# Patient Record
Sex: Female | Born: 1935 | Race: White | Hispanic: No | Marital: Single | Smoking: Never smoker
Health system: Southern US, Community
[De-identification: ages and names within clinical notes are randomized; demographics above are authoritative.]

## PROBLEM LIST (undated history)

## (undated) DIAGNOSIS — E079 Disorder of thyroid, unspecified: Secondary | ICD-10-CM

## (undated) DIAGNOSIS — F329 Major depressive disorder, single episode, unspecified: Secondary | ICD-10-CM

## (undated) DIAGNOSIS — F32A Depression, unspecified: Secondary | ICD-10-CM

## (undated) DIAGNOSIS — I4891 Unspecified atrial fibrillation: Secondary | ICD-10-CM

## (undated) DIAGNOSIS — C069 Malignant neoplasm of mouth, unspecified: Secondary | ICD-10-CM

## (undated) DIAGNOSIS — I10 Essential (primary) hypertension: Secondary | ICD-10-CM

## (undated) HISTORY — PX: PARTIAL GLOSSECTOMY: SHX2173

## (undated) HISTORY — PX: BREAST LUMPECTOMY: SHX2

---

## 2011-08-05 ENCOUNTER — Emergency Department (INDEPENDENT_AMBULATORY_CARE_PROVIDER_SITE_OTHER): Payer: Medicare Other

## 2011-08-05 ENCOUNTER — Encounter: Payer: Self-pay | Admitting: *Deleted

## 2011-08-05 ENCOUNTER — Emergency Department (HOSPITAL_BASED_OUTPATIENT_CLINIC_OR_DEPARTMENT_OTHER)
Admission: EM | Admit: 2011-08-05 | Discharge: 2011-08-05 | Disposition: A | Discharge status: Other Acute Inpatient Hospital | Payer: Medicare Other | Attending: Emergency Medicine | Admitting: Emergency Medicine

## 2011-08-05 DIAGNOSIS — E079 Disorder of thyroid, unspecified: Secondary | ICD-10-CM | POA: Insufficient documentation

## 2011-08-05 DIAGNOSIS — R079 Chest pain, unspecified: Secondary | ICD-10-CM | POA: Insufficient documentation

## 2011-08-05 DIAGNOSIS — R000 Tachycardia, unspecified: Secondary | ICD-10-CM

## 2011-08-05 DIAGNOSIS — I10 Essential (primary) hypertension: Secondary | ICD-10-CM | POA: Insufficient documentation

## 2011-08-05 DIAGNOSIS — Z79899 Other long term (current) drug therapy: Secondary | ICD-10-CM | POA: Insufficient documentation

## 2011-08-05 DIAGNOSIS — F329 Major depressive disorder, single episode, unspecified: Secondary | ICD-10-CM | POA: Insufficient documentation

## 2011-08-05 DIAGNOSIS — R0602 Shortness of breath: Secondary | ICD-10-CM

## 2011-08-05 DIAGNOSIS — F3289 Other specified depressive episodes: Secondary | ICD-10-CM | POA: Insufficient documentation

## 2011-08-05 DIAGNOSIS — I4891 Unspecified atrial fibrillation: Secondary | ICD-10-CM

## 2011-08-05 DIAGNOSIS — R42 Dizziness and giddiness: Secondary | ICD-10-CM

## 2011-08-05 DIAGNOSIS — R5381 Other malaise: Secondary | ICD-10-CM

## 2011-08-05 HISTORY — DX: Essential (primary) hypertension: I10

## 2011-08-05 HISTORY — DX: Major depressive disorder, single episode, unspecified: F32.9

## 2011-08-05 HISTORY — DX: Depression, unspecified: F32.A

## 2011-08-05 HISTORY — DX: Disorder of thyroid, unspecified: E07.9

## 2011-08-05 LAB — APTT: aPTT: 28 seconds (ref 24–37)

## 2011-08-05 LAB — DIFFERENTIAL
Basophils Absolute: 0 10*3/uL (ref 0.0–0.1)
Eosinophils Absolute: 0.5 10*3/uL (ref 0.0–0.7)
Monocytes Absolute: 1.2 10*3/uL — ABNORMAL HIGH (ref 0.1–1.0)
Neutrophils Relative %: 75 % (ref 43–77)

## 2011-08-05 LAB — CBC
MCH: 29.2 pg (ref 26.0–34.0)
MCHC: 33.3 g/dL (ref 30.0–36.0)
Platelets: 452 10*3/uL — ABNORMAL HIGH (ref 150–400)

## 2011-08-05 LAB — BASIC METABOLIC PANEL
BUN: 27 mg/dL — ABNORMAL HIGH (ref 6–23)
GFR calc Af Amer: 81 mL/min — ABNORMAL LOW (ref >90)
GFR calc non Af Amer: 70 mL/min — ABNORMAL LOW (ref >90)
Potassium: 4.1 mEq/L (ref 3.5–5.1)
Sodium: 134 mEq/L — ABNORMAL LOW (ref 135–145)

## 2011-08-05 LAB — CARDIAC PANEL(CRET KIN+CKTOT+MB+TROPI)
CK, MB: 4.9 ng/mL — ABNORMAL HIGH (ref 0.3–4.0)
Troponin I: <0.3 ng/mL (ref <0.30)

## 2011-08-05 MED ORDER — DEXTROSE 5 % IV SOLN
5.0000 mg/h | INTRAVENOUS | Status: DC
  Administered 2011-08-05 (×2): 5 mg/h via INTRAVENOUS

## 2011-08-05 MED ORDER — DILTIAZEM HCL 25 MG/5ML IV SOLN
INTRAVENOUS | Status: AC
  Administered 2011-08-05: 10 mg
  Filled 2011-08-05: qty 5

## 2011-08-05 MED ORDER — DILTIAZEM HCL 100 MG IV SOLR
INTRAVENOUS | Status: AC
  Filled 2011-08-05: qty 100

## 2011-08-05 NOTE — ED Notes (Signed)
Report called toSabrina<RN at [facility] ER

## 2011-08-05 NOTE — ED Provider Notes (Signed)
History     CSN: [id number] Arrival date & time: 08/05/2011  2:34 PM   First MD Initiated Contact with Patient 08/05/11 1457      Chief Complaint  Patient presents with  . Chest Pain    (Consider location/radiation/quality/duration/timing/severity/associated sxs/prior treatment) HPI Comments: Patient states she did have palpitations yesterday while she was in the shower and felt weak and went and laid down and felt better. She started to feel weak again today around lunch and sit up and felt very dizzy like she may pass out. She denies any chest pain numbness or weakness on just one side. Family states she looked pale and light she might pass out however she never syncopized.  Patient is a 77 y.o. female presenting with chest pain. The history is provided by the patient and a relative.  Chest Pain The chest pain began 1 - 2 hours ago. Duration of episode(s) is 2 hours. Chest pain occurs constantly. The chest pain is unchanged. Associated with: Weakness and mild shortness of breath. At its most intense, the pain is at 0/10. The pain is currently at 0/10. Pain severity now: No pain. Chest pain is worsened by exertion (Worsened when she was trying to stand up and walk). Primary symptoms include shortness of breath. Pertinent negatives for primary symptoms include no fever, no syncope, no cough, no wheezing, no palpitations and no abdominal pain. She tried nothing for the symptoms.     Past Medical History  Diagnosis Date  . Thyroid disease   . Hypertension   . Depression     Past Surgical History  Procedure Date  . Breast lumpectomy   . Partial glossectomy     History reviewed. No pertinent family history.  History  Substance Use Topics  . Smoking status: Never Smoker   . Smokeless tobacco: Not on file  . Alcohol Use: No    OB History    Grav Para Term Preterm Abortions TAB SAB Ect Mult Living                  Review of Systems  Constitutional: Negative for fever.    HENT: Negative for neck pain.        Tongue pain due to recent removal of a portion of her tongue.  Respiratory: Positive for shortness of breath. Negative for cough and wheezing.   Cardiovascular: Positive for chest pain. Negative for palpitations and syncope.  Gastrointestinal: Negative for abdominal pain.  Genitourinary: Negative for dysuria.  All other systems reviewed and are negative.    Allergies  Lyrica; Penicillins; Pravachol; and Celebrex  Home Medications   Current Outpatient Rx  Name Route Sig Dispense Refill  . ASPIRIN EC 81 MG PO TBEC Oral Take 81 mg by mouth daily.      Marland Kitchen BACITRACIN 500 UNIT/GM EX OINT Topical Apply 1 application topically 2 (two) times daily.      Marland Kitchen CALCIUM CARBONATE-VITAMIN D 600-400 MG-UNIT PO TABS Oral Take 1 tablet by mouth daily.      Marland Kitchen DIPHENHYDRAMINE HCL 25 MG PO CAPS Oral Take 25 mg by mouth at bedtime.      Marland Kitchen HYDROCODONE-ACETAMINOPHEN 7.5-500 MG PO TABS Oral Take 1 tablet by mouth every 4 (four) hours as needed.     Marland Kitchen HYDROCODONE-ACETAMINOPHEN 7.5-500 MG/15ML PO SOLN Oral Take 15 mLs by mouth every 4 (four) hours as needed. For pain     . LEVOTHYROXINE SODIUM 88 MCG PO TABS Oral Take 88 mcg by mouth daily.      Marland Kitchen  LISINOPRIL 20 MG PO TABS Oral Take 20 mg by mouth daily.      Marland Kitchen MELATONIN 3 MG PO TABS Oral Take 1 tablet by mouth at bedtime.      Marland Kitchen ONE-DAILY MULTI VITAMINS PO TABS Oral Take 1 tablet by mouth daily.      Marland Kitchen PAROXETINE HCL 10 MG PO TABS Oral Take 10 mg by mouth at bedtime.       BP 129/71  Pulse 81  Temp(Src) 98.1 F (36.7 C) (Oral)  Resp 16  Ht 5\' 3"  (1.6 m)  Wt 150 lb (68.04 kg)  BMI 26.57 kg/m2  SpO2 98%  Physical Exam  Nursing note and vitals reviewed. Constitutional: She is oriented to person, place, and time. She appears well-developed and well-nourished.       Pale and diaphoretic  HENT:  Head: Normocephalic and atraumatic.       Pain over time with a portion removed sutures in place  Eyes: EOM are normal.  Pupils are equal, round, and reactive to light.  Cardiovascular: Normal heart sounds and intact distal pulses.  An irregularly irregular rhythm present. Tachycardia present.  Exam reveals no friction rub.   No murmur heard. Pulmonary/Chest: Effort normal and breath sounds normal. She has no wheezes. She has no rales.  Abdominal: Soft. Bowel sounds are normal. She exhibits no distension. There is no tenderness. There is no rebound and no guarding.  Musculoskeletal: Normal range of motion. She exhibits no tenderness.       No edema  Neurological: She is alert and oriented to person, place, and time. No cranial nerve deficit.  Skin: Skin is warm. No rash noted. There is pallor.  Psychiatric: She has a normal mood and affect. Her behavior is normal.    ED Course  Procedures (including critical care time)  Results for orders placed during the hospital encounter of 08/05/11  CBC      Component Value Range   WBC 17.0 (*) 4.0 - 10.5 (K/uL)   RBC 5.06  3.87 - 5.11 (MIL/uL)   Hemoglobin 14.8  12.0 - 15.0 (g/dL)   HCT 16.1  09.6 - 04.5 (%)   MCV 87.9  78.0 - 100.0 (fL)   MCH 29.2  26.0 - 34.0 (pg)   MCHC 33.3  30.0 - 36.0 (g/dL)   RDW 40.9  81.1 - 91.4 (%)   Platelets 452 (*) 150 - 400 (K/uL)  DIFFERENTIAL      Component Value Range   Neutrophils Relative 75  43 - 77 (%)   Lymphocytes Relative 15  12 - 46 (%)   Monocytes Relative 7  3 - 12 (%)   Eosinophils Relative 3  0 - 5 (%)   Basophils Relative 0  0 - 1 (%)   Neutro Abs 12.7 (*) 1.7 - 7.7 (K/uL)   Lymphs Abs 2.6  0.7 - 4.0 (K/uL)   Monocytes Absolute 1.2 (*) 0.1 - 1.0 (K/uL)   Eosinophils Absolute 0.5  0.0 - 0.7 (K/uL)   Basophils Absolute 0.0  0.0 - 0.1 (K/uL)  BASIC METABOLIC PANEL      Component Value Range   Sodium 134 (*) 135 - 145 (mEq/L)   Potassium 4.1  3.5 - 5.1 (mEq/L)   Chloride 95 (*) 96 - 112 (mEq/L)   CO2 29  19 - 32 (mEq/L)   Glucose, Bld 155 (*) 70 - 99 (mg/dL)   BUN 27 (*) 6 - 23 (mg/dL)   Creatinine, Ser  7.82  0.50 -  1.10 (mg/dL)   Calcium 60.4  8.4 - 10.5 (mg/dL)   GFR calc non Af Amer 70 (*) >90 (mL/min)   GFR calc Af Amer 81 (*) >90 (mL/min)  CARDIAC PANEL(CRET KIN+CKTOT+MB+TROPI)      Component Value Range   Total CK 50  7 - 177 (U/L)   CK, MB 4.9 (*) 0.3 - 4.0 (ng/mL)   Troponin I <0.30  <0.30 (ng/mL)   Relative Index RELATIVE INDEX IS INVALID  0.0 - 2.5   PROTIME-INR      Component Value Range   Prothrombin Time 12.5  11.6 - 15.2 (seconds)   INR 0.91  0.00 - 1.49   APTT      Component Value Range   aPTT 28  24 - 37 (seconds)   Dg Chest 2 View  08/05/2011  *RADIOLOGY REPORT*  Clinical Data: Rapid heart rate, dizziness, weakness, shortness of breath  CHEST - 2 VIEW  Comparison: None.  Findings: The cardiac silhouette, mediastinum, pulmonary vasculature are within normal limits.  Both lungs are clear. There is no acute bony abnormality.  IMPRESSION: There is no evidence of acute cardiac or pulmonary process.  Original Report Authenticated By: Brandon Melnick, M.D.     Date: 08/05/2011  Rate: 160  Rhythm: atrial fibrillation with RVR  QRS Axis: normal  Intervals: normal  ST/T Wave abnormalities: normal  Conduction Disutrbances:none  Narrative Interpretation:   Old EKG Reviewed: none available   Date: 08/05/2011  Rate: 103  Rhythm: normal sinus rhythm  QRS Axis: normal  Intervals: normal  ST/T Wave abnormalities: normal  Conduction Disutrbances:second-degree A-V block, ( Mobitz I )  Narrative Interpretation:   Old EKG Reviewed: changes noted   Date: 08/05/2011  Rate: 83  Rhythm: normal sinus rhythm  QRS Axis: normal  Intervals: normal  ST/T Wave abnormalities: normal  Conduction Disutrbances:none  Narrative Interpretation:   Old EKG Reviewed: changes noted   CRITICAL CARE Performed by: Gwyneth Sprout   Total critical care time: 45  Critical care time was exclusive of separately billable procedures and treating other patients.  Critical care was  necessary to treat or prevent imminent or life-threatening deterioration.  Critical care was time spent personally by me on the following activities: development of treatment plan with patient and/or surrogate as well as nursing, discussions with consultants, evaluation of patient's response to treatment, examination of patient, obtaining history from patient or surrogate, ordering and performing treatments and interventions, ordering and review of laboratory studies, ordering and review of radiographic studies, pulse oximetry and re-evaluation of patient's condition.   No diagnosis found.    MDM   Patient presented with weakness and was found to be in new onset A. fib with RVR. Patient has never been in this rhythm before. However over the last few weeks she is undergone surgery E. 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 Daughter says she has just been in pain at home but denies any fever shortness of breath coughing. Initially after a bolus of 10 mg of diltiazem and patient went from a heart rate of 160s to low 100s where she then developed a sinus rhythm with a Mobitz type I defect. And then went into a normal sinus rhythm with a rate of 80. However after one hour of observation patient went back into the irregular rhythm with a Mobitz type I and then developed A. fib once again. Patient was placed on a Cardizem drip at 5. Labs showed a leukocytosis of 1700 which is  less likely from the most recent surgery due to no other infectious symptoms doubt new infection. The labs were unremarkable. And will admit for further care.         Gwyneth Sprout, MD 08/05/11 [phone]

## 2011-08-05 NOTE — ED Notes (Signed)
Back into sinus ryth b/p 138/77  p78    Family remains at bedside

## 2011-08-05 NOTE — ED Notes (Signed)
bp 148/77  Hr 78   cardizem drip started

## 2011-08-05 NOTE — ED Notes (Signed)
Pt brought directly to room 6 in w/c by triage nurse, stating that manual pulse is weak, irregular and thready. Pt is pale, states that she feels weak, dizzy and sob at times. Pt had oral surgery last week for tongue cancer, and started feeling weak and dizzy today. Pt placed on monitor, hr in 160's irregular, dr. Anitra Lauth alerted to bedside, ekg in progress, iv access x 2 obtained while pt being triaged.

## 2013-07-23 IMAGING — CR DG CHEST 2V
2 series · 2 of 2 positions shown · non-contrast
Comparison: None.

CLINICAL DATA: Rapid heart rate, dizziness, weakness, shortness of
breath

CHEST - 2 VIEW

[w chest pa]
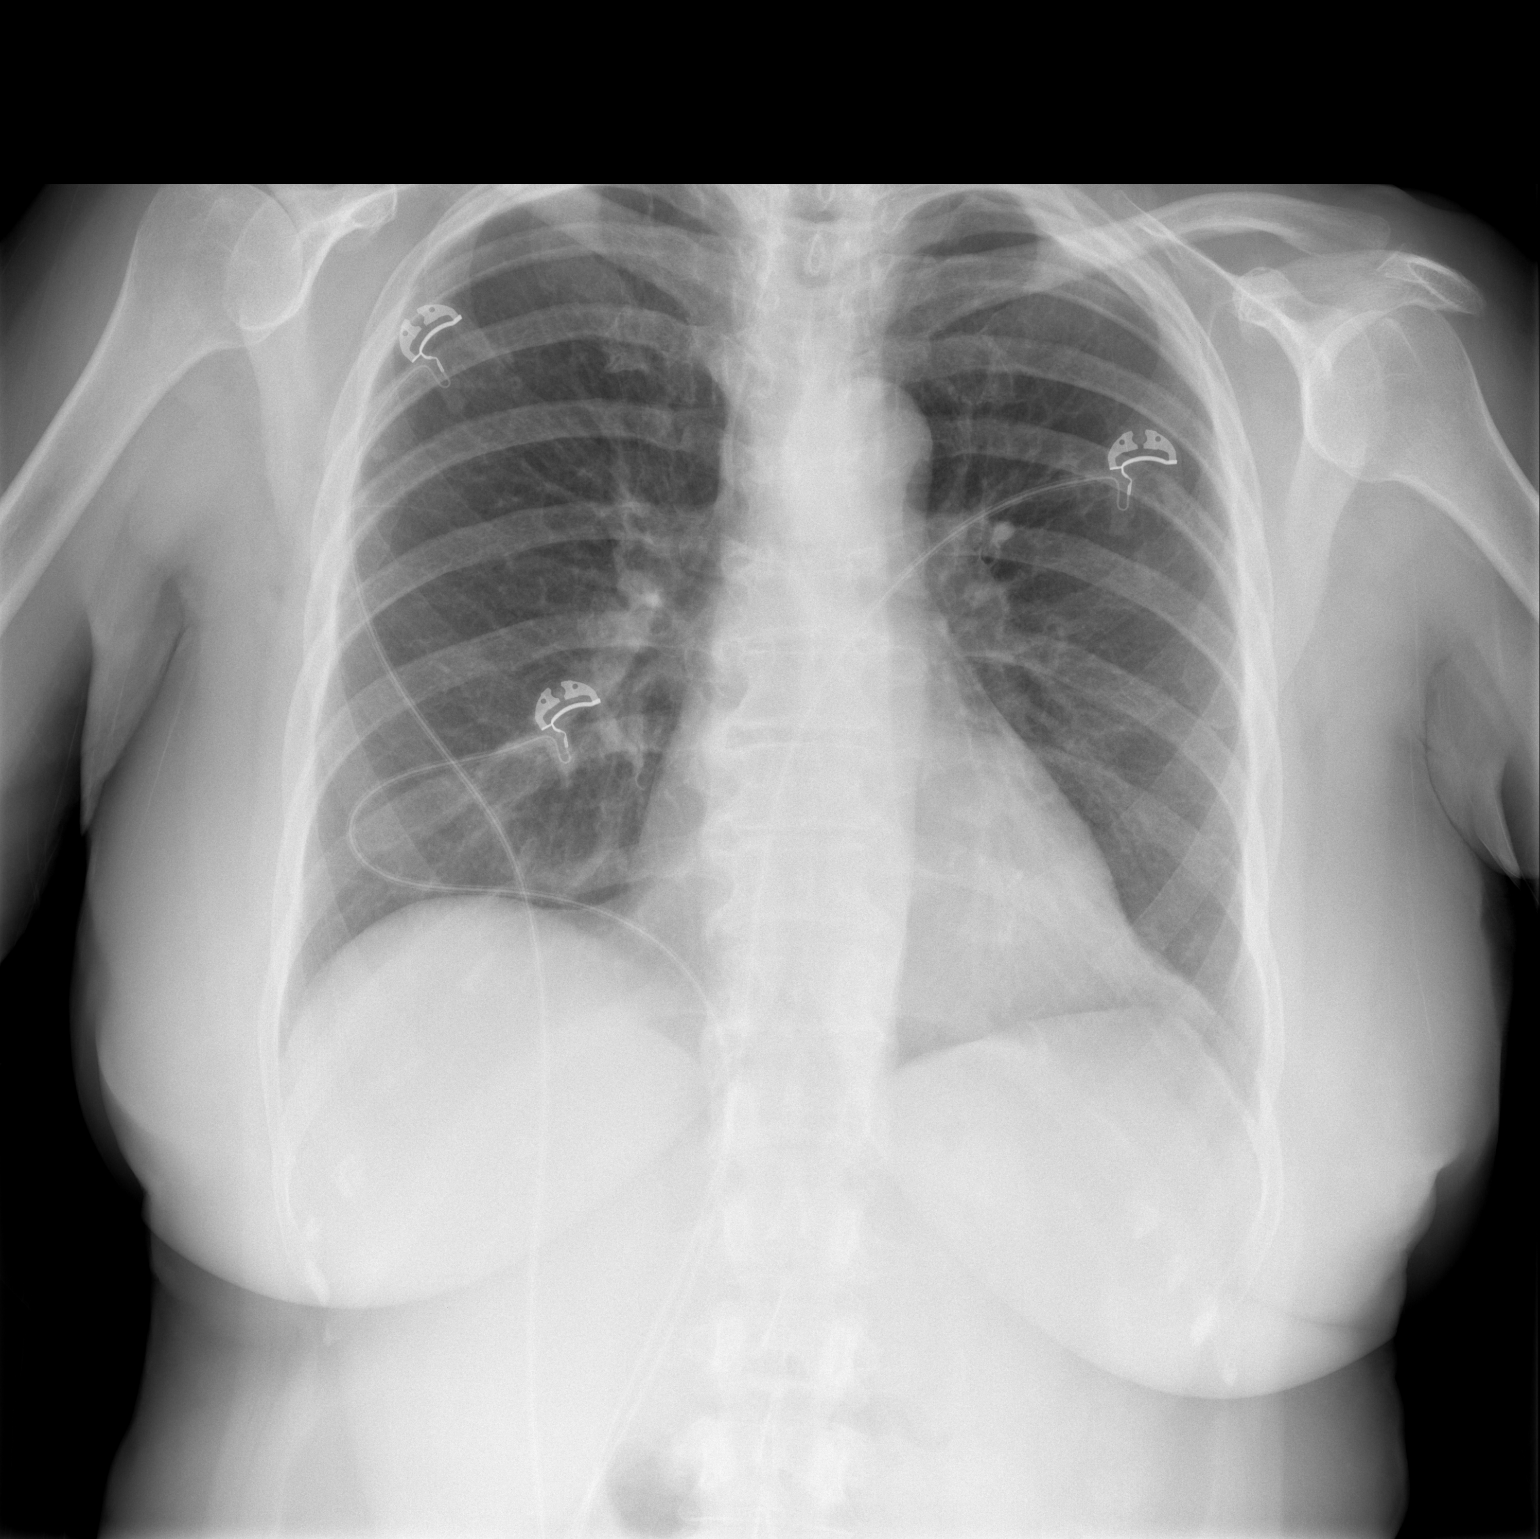

[w chest lat]
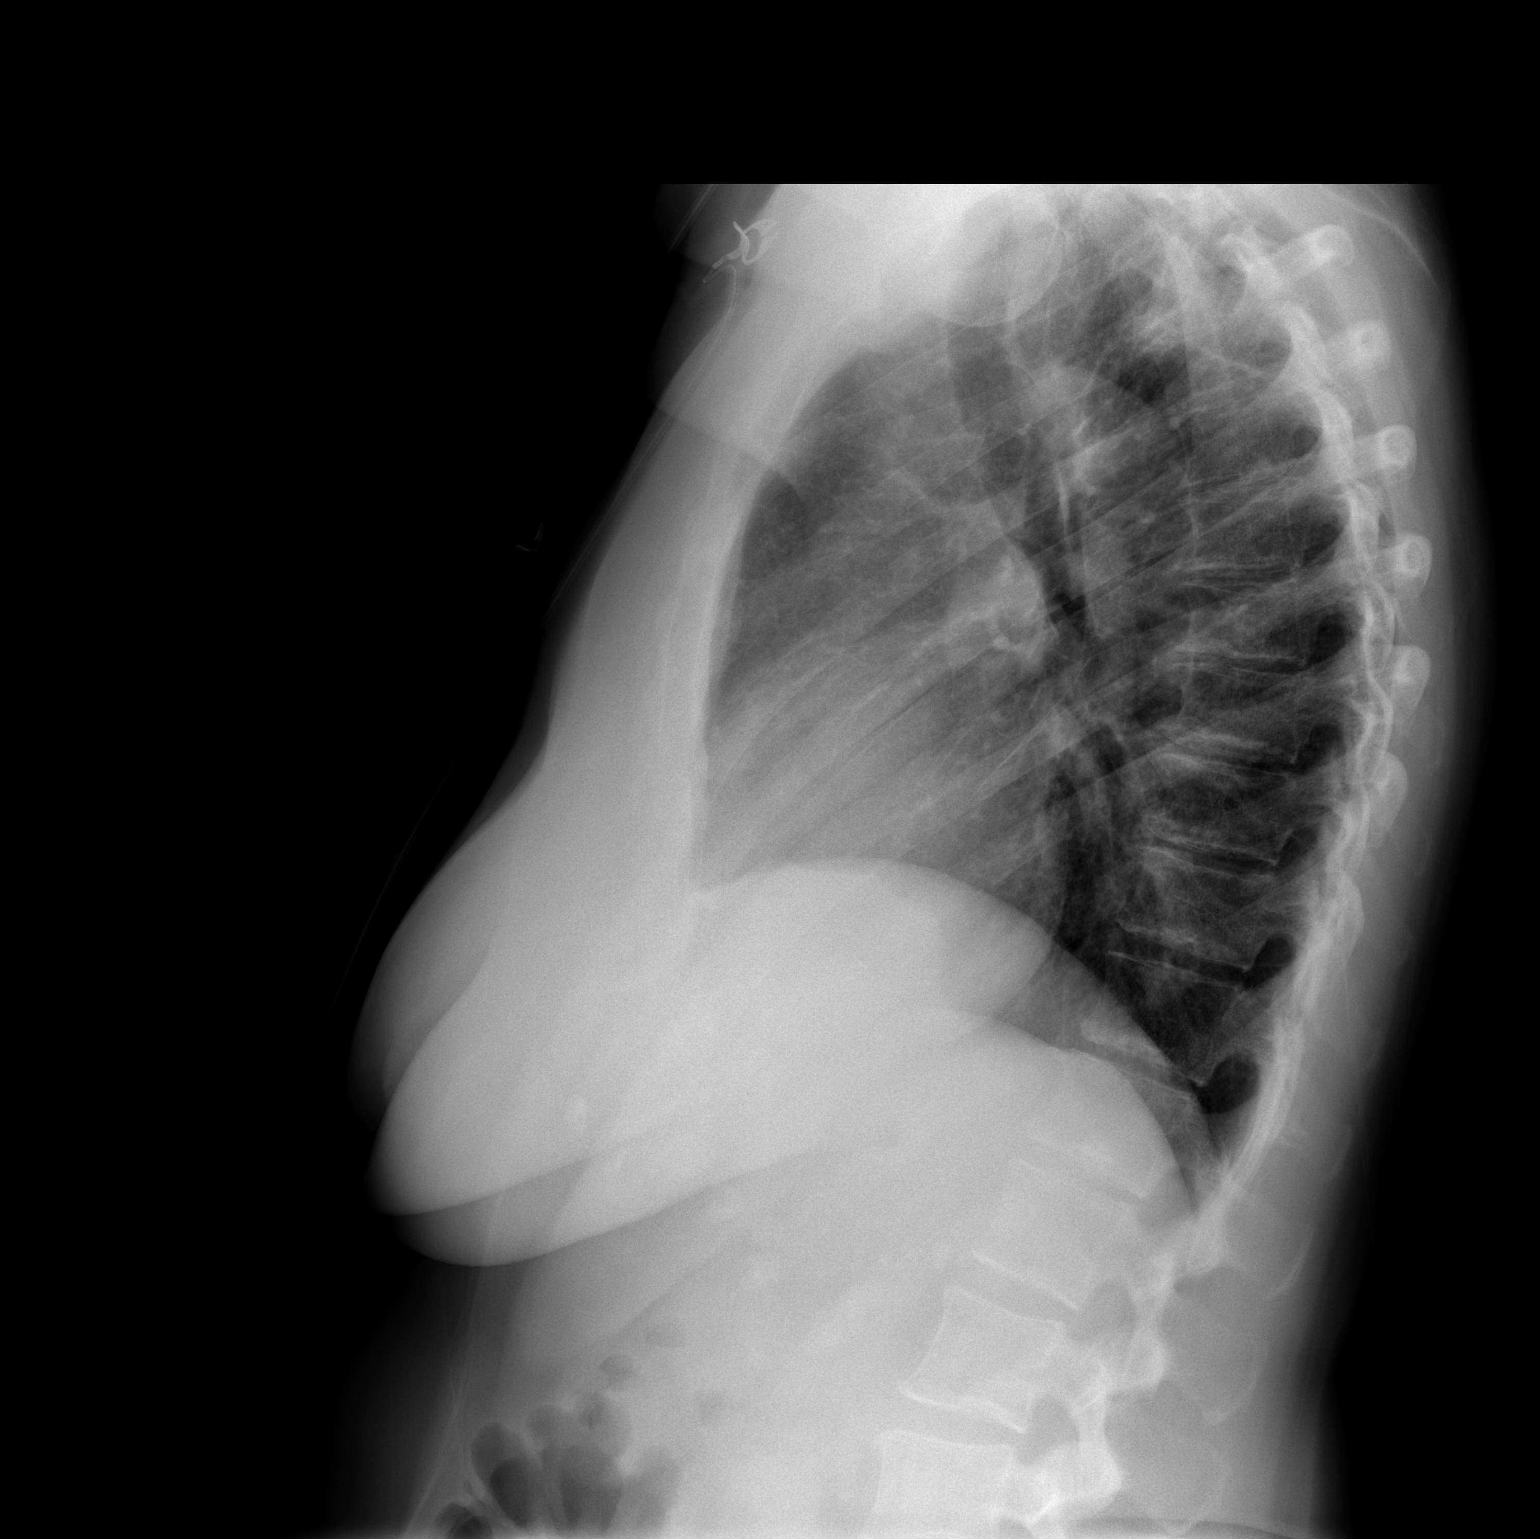

[2 of 2 positions shown; findings below may reference images not displayed]

FINDINGS: The cardiac silhouette, mediastinum, pulmonary
vasculature are within normal limits.  Both lungs are clear.
There is no acute bony abnormality.
IMPRESSION: There is no evidence of acute cardiac or pulmonary process.

## 2014-12-02 ENCOUNTER — Encounter (HOSPITAL_BASED_OUTPATIENT_CLINIC_OR_DEPARTMENT_OTHER): Payer: Self-pay

## 2014-12-02 ENCOUNTER — Emergency Department (HOSPITAL_BASED_OUTPATIENT_CLINIC_OR_DEPARTMENT_OTHER)
Admission: EM | Admit: 2014-12-02 | Discharge: 2014-12-02 | Disposition: A | Discharge status: Home / Self Care | Payer: Medicare Other | Attending: Emergency Medicine | Admitting: Emergency Medicine

## 2014-12-02 DIAGNOSIS — I10 Essential (primary) hypertension: Secondary | ICD-10-CM | POA: Insufficient documentation

## 2014-12-02 DIAGNOSIS — Z85818 Personal history of malignant neoplasm of other sites of lip, oral cavity, and pharynx: Secondary | ICD-10-CM | POA: Diagnosis not present

## 2014-12-02 DIAGNOSIS — Z7982 Long term (current) use of aspirin: Secondary | ICD-10-CM | POA: Insufficient documentation

## 2014-12-02 DIAGNOSIS — Z79899 Other long term (current) drug therapy: Secondary | ICD-10-CM | POA: Diagnosis not present

## 2014-12-02 DIAGNOSIS — Z792 Long term (current) use of antibiotics: Secondary | ICD-10-CM | POA: Insufficient documentation

## 2014-12-02 DIAGNOSIS — E079 Disorder of thyroid, unspecified: Secondary | ICD-10-CM | POA: Diagnosis not present

## 2014-12-02 DIAGNOSIS — Z880 Allergy status to penicillin: Secondary | ICD-10-CM | POA: Diagnosis not present

## 2014-12-02 DIAGNOSIS — L03032 Cellulitis of left toe: Secondary | ICD-10-CM

## 2014-12-02 DIAGNOSIS — I4891 Unspecified atrial fibrillation: Secondary | ICD-10-CM | POA: Diagnosis not present

## 2014-12-02 DIAGNOSIS — F329 Major depressive disorder, single episode, unspecified: Secondary | ICD-10-CM | POA: Insufficient documentation

## 2014-12-02 DIAGNOSIS — M79675 Pain in left toe(s): Secondary | ICD-10-CM | POA: Diagnosis present

## 2014-12-02 HISTORY — DX: Malignant neoplasm of mouth, unspecified: C06.9

## 2014-12-02 HISTORY — DX: Unspecified atrial fibrillation: I48.91

## 2014-12-02 MED ORDER — OXYCODONE-ACETAMINOPHEN 5-325 MG PO TABS
2.0000 | ORAL_TABLET | Freq: Once | ORAL | Status: AC
  Administered 2014-12-02: 2 via ORAL
  Filled 2014-12-02: qty 2

## 2014-12-02 MED ORDER — LIDOCAINE HCL 2 % IJ SOLN
20.0000 mL | Freq: Once | INTRAMUSCULAR | Status: AC
  Administered 2014-12-02: 400 mg via INTRADERMAL
  Filled 2014-12-02: qty 20

## 2014-12-02 NOTE — Discharge Instructions (Signed)
Wash the affected area with soap and water and apply a thin layer of topical antibiotic ointment. Do this every 12 hours.   Do not use rubbing alcohol or hydrogen peroxide.                        Look for signs of infection: if you see redness, if the area becomes warm, if pain increases sharply, there is discharge (pus), if red streaks appear or you develop fever or vomiting, RETURN immediately to the Emergency Department  for a recheck.   Please follow with your primary care doctor in the next 2 days for a check-up. They must obtain records for further management.   Do not hesitate to return to the Emergency Department for any new, worsening or concerning symptoms.   Paronychia Paronychia is an inflammatory reaction involving the folds of the skin surrounding the fingernail. This is commonly caused by an infection in the skin around a nail. The most common cause of paronychia is frequent wetting of the hands (as seen with bartenders, food servers, nurses or others who wet their hands). This makes the skin around the fingernail susceptible to infection by bacteria (germs) or fungus. Other predisposing factors are:  Aggressive manicuring.  Nail biting.  Thumb sucking. The most common cause is a staphylococcal (a type of germ) infection, or a fungal (Candida) infection. When caused by a germ, it usually comes on suddenly with redness, swelling, pus and is often painful. It may get under the nail and form an abscess (collection of pus), or form an abscess around the nail. If the nail itself is infected with a fungus, the treatment is usually prolonged and may require oral medicine for up to one year. Your caregiver will determine the length of time treatment is required. The paronychia caused by bacteria (germs) may largely be avoided by not pulling on hangnails or picking at cuticles. When the infection occurs at the tips of the finger it is called felon. When the cause of paronychia is from the  herpes simplex virus (HSV) it is called herpetic whitlow. TREATMENT  When an abscess is present treatment is often incision and drainage. This means that the abscess must be cut open so the pus can get out. When this is done, the following home care instructions should be followed. HOME CARE INSTRUCTIONS   It is important to keep the affected fingers very dry. Rubber or plastic gloves over cotton gloves should be used whenever the hand must be placed in water.  Keep wound clean, dry and dressed as suggested by your caregiver between warm soaks or warm compresses.  Soak in warm water for fifteen to twenty minutes three to four times per day for bacterial infections. Fungal infections are very difficult to treat, so often require treatment for long periods of time.  For bacterial (germ) infections take antibiotics (medicine which kill germs) as directed and finish the prescription, even if the problem appears to be solved before the medicine is gone.  Only take over-the-counter or prescription medicines for pain, discomfort, or fever as directed by your caregiver. SEEK IMMEDIATE MEDICAL CARE IF:  You have redness, swelling, or increasing pain in the wound.  You notice pus coming from the wound.  You have a fever.  You notice a bad smell coming from the wound or dressing. Document Released: 02/18/2001 Document Revised: 11/17/2011 Document Reviewed: 10/20/2008 [facility] Patient Information 2015 [place], [place]. This information is not intended to replace advice  given to you by your health care provider. Make sure you discuss any questions you have with your health care provider.

## 2014-12-02 NOTE — ED Notes (Signed)
D/c home with ride- no new rx given- pt verbalizes understanding to continue taking septra until gone

## 2014-12-02 NOTE — ED Notes (Signed)
Pt with redness and inflammation of L great toe, no injury, noted inflammation to L cuticle area.

## 2014-12-02 NOTE — ED Provider Notes (Signed)
CSN: [id number]     Arrival date & time 12/02/14  1233 History   First MD Initiated Contact with Patient 12/02/14 1243     Chief Complaint  Patient presents with  . Toe Pain     (Consider location/radiation/quality/duration/timing/severity/associated sxs/prior Treatment) HPI   Lori Schultz is a 81 y.o. female S medical history significant for A. fib ((taking Pradaxa) sees actively undergoing chemotherapy for mouth cancer, followed at [facility] complaining of soreness and swelling to left great toe onset 2 days ago patient denies trauma, she does not get pedicures. She's had no similar prior episodes. States she has a low white blood cell count due to the chemotherapy, she does not take any neupogen shots. Takes minocycline  100 mg twice a day for chemotherapy-induced skin issues.  Chart review from [facility] March 24 shows a absolute neutrophil count of 9000  Past Medical History  Diagnosis Date  . Thyroid disease   . Hypertension   . Depression   . Atrial fibrillation   . Mouth cancer    Past Surgical History  Procedure Laterality Date  . Breast lumpectomy    . Partial glossectomy     No family history on file. History  Substance Use Topics  . Smoking status: Never Smoker   . Smokeless tobacco: Not on file  . Alcohol Use: No   OB History    No data available     Review of Systems  10 systems reviewed and found to be negative, except as noted in the HPI.   Allergies  Penicillins; Pravachol; Pregabalin; and Celebrex  Home Medications   Prior to Admission medications   Medication Sig Start Date End Date Taking? Authorizing Provider  dabigatran (PRADAXA) 150 MG CAPS capsule Take 150 mg by mouth 2 (two) times daily.   Yes Historical Provider, MD  diltiazem (DILACOR XR) 180 MG 24 hr capsule Take 180 mg by mouth daily.   Yes Historical Provider, MD  LORazepam (ATIVAN) 0.5 MG tablet Take 0.5 mg by mouth every 8 (eight) hours.   Yes Historical Provider, MD  magnesium oxide  (MAG-OX) 400 MG tablet Take 400 mg by mouth daily.   Yes Historical Provider, MD  minocycline (DYNACIN) 100 MG tablet Take 100 mg by mouth 2 (two) times daily.   Yes Historical Provider, MD  aspirin EC 81 MG tablet Take 81 mg by mouth daily.      Historical Provider, MD  bacitracin 500 UNIT/GM ointment Apply 1 application topically 2 (two) times daily.      Historical Provider, MD  Calcium Carbonate-Vitamin D (CALTRATE 600+D) 600-400 MG-UNIT per tablet Take 1 tablet by mouth daily.      Historical Provider, MD  diphenhydrAMINE (BENADRYL) 25 mg capsule Take 25 mg by mouth at bedtime.      Historical Provider, MD  HYDROcodone-acetaminophen (LORTAB) 7.5-500 MG per tablet Take 1 tablet by mouth every 4 (four) hours as needed.     Historical Provider, MD  HYDROcodone-acetaminophen (LORTAB) 7.5-500 MG/15ML solution Take 15 mLs by mouth every 4 (four) hours as needed. For pain     Historical Provider, MD  levothyroxine (SYNTHROID, LEVOTHROID) 88 MCG tablet Take 88 mcg by mouth daily.      Historical Provider, MD  lisinopril (PRINIVIL,ZESTRIL) 20 MG tablet Take 20 mg by mouth daily.      Historical Provider, MD  Melatonin 3 MG TABS Take 1 tablet by mouth at bedtime.      Historical Provider, MD  Multiple Vitamin (MULTIVITAMIN) tablet Take  1 tablet by mouth daily.      Historical Provider, MD  PARoxetine (PAXIL) 10 MG tablet Take 10 mg by mouth at bedtime.     Historical Provider, MD   BP 120/57 mmHg  Pulse 81  Temp(Src) 98 F (36.7 C) (Oral)  Resp 16  Ht 5\' 2"  (1.575 m)  Wt 145 lb (65.772 kg)  BMI 26.51 kg/m2  SpO2 97% Physical Exam  Constitutional: She is oriented to person, place, and time. She appears well-developed and well-nourished. No distress.  HENT:  Head: Normocephalic.  Eyes: Conjunctivae and EOM are normal.  Cardiovascular: Normal rate.   Pulmonary/Chest: Effort normal. No stridor.  Musculoskeletal: Normal range of motion.  Neurological: She is alert and oriented to person, place,  and time.  Skin:  Fluctuant paronychia to medial side of left great toe  Psychiatric: She has a normal mood and affect.  Nursing note and vitals reviewed.   ED Course  INCISION AND DRAINAGE Date/Time: 12/02/2014 1:45 PM Performed by: Monico Blitz Authorized by: Monico Blitz Consent: Verbal consent obtained. Consent given by: patient Required items: required blood products, implants, devices, and special equipment available Patient identity confirmed: verbally with patient Indications for incision and drainage: Paronychia. Location: Left great toe. Anesthesia: digital block Local anesthetic: lidocaine 2% without epinephrine Anesthetic total: 5 ml Patient sedated: no Scalpel size: 11 Incision type: single straight Complexity: simple Drainage: purulent Drainage amount: moderate Wound treatment: wound left open Packing material: Vaseline gauze Patient tolerance: Patient tolerated the procedure well with no immediate complications   (including critical care time) Labs Review Labs Reviewed - No data to display  Imaging Review No results found.   EKG Interpretation None      MDM   Final diagnoses:  Paronychia of great toe, left    Filed Vitals:   12/02/14 1237 12/02/14 1342  BP: 120/57 114/77  Pulse: 81 70  Temp: 98 F (36.7 C) 98.2 F (36.8 C)  TempSrc: Oral Oral  Resp: 16 16  Height: 5\' 2"  (1.575 m)   Weight: 145 lb (65.772 kg)   SpO2: 97% 95%    Medications  oxyCODONE-acetaminophen (PERCOCET/ROXICET) 5-325 MG per tablet 2 tablet (2 tablets Oral Given 12/02/14 1307)  lidocaine (XYLOCAINE) 2 % (with pres) injection 400 mg (400 mg Intradermal Given by Other 12/02/14 1335)    Lori Schultz is a pleasant 81 y.o. female presenting with paronychia left great toe. Patient is actively undergoing chemotherapy for oral cancer. She is not neutropenic as per blood draw at [facility] 2 days ago. Incision and drainage performed, instructed patient and her  daughter on wound care. Patient is to continue to take minocycline which she's taking for unrelated condition.  Evaluation does not show pathology that would require ongoing emergent intervention or inpatient treatment. Pt is hemodynamically stable and mentating appropriately. Discussed findings and plan with patient/guardian, who agrees with care plan. All questions answered. Return precautions discussed and outpatient follow up given.        Monico Blitz, PA-C 12/02/14 1347  Elnora Morrison, MD 12/03/14 [phone]

## 2014-12-28 ENCOUNTER — Encounter (HOSPITAL_BASED_OUTPATIENT_CLINIC_OR_DEPARTMENT_OTHER): Payer: Self-pay | Admitting: *Deleted

## 2014-12-28 ENCOUNTER — Emergency Department (HOSPITAL_BASED_OUTPATIENT_CLINIC_OR_DEPARTMENT_OTHER)
Admission: EM | Admit: 2014-12-28 | Discharge: 2014-12-28 | Disposition: A | Discharge status: Home / Self Care | Payer: Medicare Other | Attending: Emergency Medicine | Admitting: Emergency Medicine

## 2014-12-28 DIAGNOSIS — F329 Major depressive disorder, single episode, unspecified: Secondary | ICD-10-CM | POA: Insufficient documentation

## 2014-12-28 DIAGNOSIS — Z880 Allergy status to penicillin: Secondary | ICD-10-CM | POA: Insufficient documentation

## 2014-12-28 DIAGNOSIS — Z79899 Other long term (current) drug therapy: Secondary | ICD-10-CM | POA: Insufficient documentation

## 2014-12-28 DIAGNOSIS — M79675 Pain in left toe(s): Secondary | ICD-10-CM | POA: Diagnosis present

## 2014-12-28 DIAGNOSIS — I10 Essential (primary) hypertension: Secondary | ICD-10-CM | POA: Diagnosis not present

## 2014-12-28 DIAGNOSIS — E079 Disorder of thyroid, unspecified: Secondary | ICD-10-CM | POA: Diagnosis not present

## 2014-12-28 DIAGNOSIS — I4891 Unspecified atrial fibrillation: Secondary | ICD-10-CM | POA: Insufficient documentation

## 2014-12-28 DIAGNOSIS — Z7982 Long term (current) use of aspirin: Secondary | ICD-10-CM | POA: Insufficient documentation

## 2014-12-28 DIAGNOSIS — L03012 Cellulitis of left finger: Secondary | ICD-10-CM

## 2014-12-28 DIAGNOSIS — L03032 Cellulitis of left toe: Secondary | ICD-10-CM | POA: Insufficient documentation

## 2014-12-28 DIAGNOSIS — Z85818 Personal history of malignant neoplasm of other sites of lip, oral cavity, and pharynx: Secondary | ICD-10-CM | POA: Insufficient documentation

## 2014-12-28 MED ORDER — CEPHALEXIN 500 MG PO CAPS: 500.0000 mg | ORAL_CAPSULE | Freq: Four times a day (QID) | ORAL | Status: DC

## 2014-12-28 NOTE — ED Provider Notes (Signed)
CSN: [id number]     Arrival date & time 12/28/14  1641 History   First MD Initiated Contact with Patient 12/28/14 1709     Chief Complaint  Patient presents with  . Toe Pain     (Consider location/radiation/quality/duration/timing/severity/associated sxs/prior Treatment) HPI Comments: Pt comes in with c/o recurrent toe pain and drainage. Pt states that she was seen a couple of weeks ago and had the left great toenail cut back and placed in antibiotics. She states that the area had gotten better and then in the last 2 days it has started to hurt again and she noticed today that it was draining. No fever. Pt is currently undergoing chemo  The history is provided by the patient. No language interpreter was used.    Past Medical History  Diagnosis Date  . Thyroid disease   . Hypertension   . Depression   . Atrial fibrillation   . Mouth cancer    Past Surgical History  Procedure Laterality Date  . Breast lumpectomy    . Partial glossectomy     No family history on file. History  Substance Use Topics  . Smoking status: Never Smoker   . Smokeless tobacco: Not on file  . Alcohol Use: No   OB History    No data available     Review of Systems  All other systems reviewed and are negative.     Allergies  Penicillins; Pravachol; Pregabalin; and Celebrex  Home Medications   Prior to Admission medications   Medication Sig Start Date End Date Taking? Authorizing Provider  aspirin EC 81 MG tablet Take 81 mg by mouth daily.      Historical Provider, MD  bacitracin 500 UNIT/GM ointment Apply 1 application topically 2 (two) times daily.      Historical Provider, MD  Calcium Carbonate-Vitamin D (CALTRATE 600+D) 600-400 MG-UNIT per tablet Take 1 tablet by mouth daily.      Historical Provider, MD  cephALEXin (KEFLEX) 500 MG capsule Take 1 capsule (500 mg total) by mouth 4 (four) times daily. 12/28/14   Glendell Docker, NP  dabigatran (PRADAXA) 150 MG CAPS capsule Take 150 mg by  mouth 2 (two) times daily.    Historical Provider, MD  diltiazem (DILACOR XR) 180 MG 24 hr capsule Take 180 mg by mouth daily.    Historical Provider, MD  diphenhydrAMINE (BENADRYL) 25 mg capsule Take 25 mg by mouth at bedtime.      Historical Provider, MD  HYDROcodone-acetaminophen (LORTAB) 7.5-500 MG per tablet Take 1 tablet by mouth every 4 (four) hours as needed.     Historical Provider, MD  HYDROcodone-acetaminophen (LORTAB) 7.5-500 MG/15ML solution Take 15 mLs by mouth every 4 (four) hours as needed. For pain     Historical Provider, MD  levothyroxine (SYNTHROID, LEVOTHROID) 88 MCG tablet Take 88 mcg by mouth daily.      Historical Provider, MD  lisinopril (PRINIVIL,ZESTRIL) 20 MG tablet Take 20 mg by mouth daily.      Historical Provider, MD  LORazepam (ATIVAN) 0.5 MG tablet Take 0.5 mg by mouth every 8 (eight) hours.    Historical Provider, MD  magnesium oxide (MAG-OX) 400 MG tablet Take 400 mg by mouth daily.    Historical Provider, MD  Melatonin 3 MG TABS Take 1 tablet by mouth at bedtime.      Historical Provider, MD  minocycline (DYNACIN) 100 MG tablet Take 100 mg by mouth 2 (two) times daily.    Historical Provider, MD  Multiple Vitamin (  MULTIVITAMIN) tablet Take 1 tablet by mouth daily.      Historical Provider, MD  PARoxetine (PAXIL) 10 MG tablet Take 10 mg by mouth at bedtime.     Historical Provider, MD   BP 153/72 mmHg  Pulse 68  Temp(Src) 100 F (37.8 C) (Oral)  Resp 18  Ht 5\' 3"  (1.6 m)  Wt 145 lb (65.772 kg)  BMI 25.69 kg/m2  SpO2 100% Physical Exam  Constitutional: She is oriented to person, place, and time. She appears well-developed and well-nourished.  Cardiovascular: Normal rate and regular rhythm.   Pulmonary/Chest: Effort normal and breath sounds normal.  Musculoskeletal: Normal range of motion.  Neurological: She is alert and oriented to person, place, and time.  Skin:  Swelling noted to the lateral aspect of the left great toe nail. purulent drainage  noted.mild redness  Nursing note and vitals reviewed.   ED Course  Procedures (including critical care time) Labs Review Labs Reviewed - No data to display  Imaging Review No results found.   EKG Interpretation None      MDM   Final diagnoses:  Paronychia, left    anc 1800 last week. Will treat with keflex. Area is already draining no incision need to be done at this time. Discussed follow up with podiatry with pt    Glendell Docker, NP 12/28/14 1738  Evelina Bucy, MD 12/29/14 [id number]

## 2014-12-28 NOTE — ED Notes (Signed)
Pain in her left great toe. She was here for same 3 weeks ago for infection under her nailbed.

## 2014-12-28 NOTE — Discharge Instructions (Signed)
Paronychia  Paronychia is an infection of the skin caused by germs. It happens by the fingernail or toenail. You can avoid it by not:  Pulling on hangnails.  Nail biting.  Thumb sucking.  Cutting fingernails and toenails too short.  Cutting the skin at the base and sides of the fingernail or toenail (cuticle). HOME CARE  Keep the fingers or toes very dry. Put rubber gloves over cotton gloves when putting hands in water.  Keep the wound clean and bandaged (dressed) as told by your doctor.  Soak the fingers or toes in warm water for 15 to 20 minutes. Soak them 3 to 4 times per day for germ infections. Fungal infections are difficult to treat. Fungal infections often require treatment for a long time.  Only take medicine as told by your doctor. GET HELP RIGHT AWAY IF:   You have redness, puffiness (swelling), or pain that gets worse.  You see yellowish-white fluid (pus) coming from the wound.  You have a fever.  You have a bad smell coming from the wound or bandage. MAKE SURE YOU:  Understand these instructions.  Will watch your condition.  Will get help if you are not doing well or get worse. Document Released: 08/13/2009 Document Revised: 11/17/2011 Document Reviewed: 08/13/2009 [employer] Patient Information 2015 [employer]. This information is not intended to replace advice given to you by your health care provider. Make sure you discuss any questions you have with your health care provider.  

## 2015-08-09 DEATH — deceased

## 2017-08-05 ENCOUNTER — Other Ambulatory Visit: Payer: Self-pay | Admitting: Orthopedic Surgery

## 2017-08-05 DIAGNOSIS — S52571D Other intraarticular fracture of lower end of right radius, subsequent encounter for closed fracture with routine healing: Secondary | ICD-10-CM
